# Patient Record
Sex: Male | Born: 1995 | Race: White | Hispanic: No | Marital: Married | Smoking: Never smoker
Health system: Southern US, Community
[De-identification: ages and names within clinical notes are randomized; demographics above are authoritative.]

## PROBLEM LIST (undated history)

## (undated) DIAGNOSIS — J45909 Unspecified asthma, uncomplicated: Secondary | ICD-10-CM

## (undated) DIAGNOSIS — K3580 Unspecified acute appendicitis: Secondary | ICD-10-CM

## (undated) DIAGNOSIS — F909 Attention-deficit hyperactivity disorder, unspecified type: Secondary | ICD-10-CM

## (undated) HISTORY — PX: CIRCUMCISION: SUR203

---
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
## 2015-01-07 ENCOUNTER — Ambulatory Visit (INDEPENDENT_AMBULATORY_CARE_PROVIDER_SITE_OTHER): Payer: 59 | Admitting: Pediatrics

## 2015-01-07 ENCOUNTER — Encounter: Payer: Self-pay | Admitting: Pediatrics

## 2015-01-07 VITALS — BP 112/58 | HR 64 | Ht 73.0 in | Wt 146.2 lb

## 2015-01-07 DIAGNOSIS — G2569 Other tics of organic origin: Secondary | ICD-10-CM

## 2015-01-07 DIAGNOSIS — F988 Other specified behavioral and emotional disorders with onset usually occurring in childhood and adolescence: Secondary | ICD-10-CM

## 2015-01-07 DIAGNOSIS — F909 Attention-deficit hyperactivity disorder, unspecified type: Secondary | ICD-10-CM

## 2015-01-07 NOTE — Progress Notes (Signed)
Patient: Brandon Juarez MRN: [id number] Sex: male DOB: Jan 26, 1996  Provider: Deetta Perla, MD Location of Care: [facility]  Note type: Routine return visit  History of Present Illness: Referral Source: Dr. Nila Nephew History from: mother and patient Chief Complaint: ADD  Brandon Juarez is a 19 y.o. male referred for evaluation of ADD and motor tics who is being seen here for follow-up. Brandon Juarez says he is doing well. Since decreasing to the  of vyvanse, he has been having less side effects. His appetite is good and his sleep is so good, that he has stopped taking the clonidine at night for sleep.   He says he is able to focus through 6th period in school and has an IEP where he only has a new class for 1st-5th period and sits though the calculus lecture a second time for 6th period. He says homework is a struggle, but he is taking rigorous classes, including 2 AP classes. His grades are 2 As, 2 Bs and 1 C.   He continues to have motor tics, but they have improved since lowering the vyvanse dose. These tics do not interfere with his daily activities. He and his mom are both satisfied with where he in on his current medicine regimen.   Review of Systems: 12 system review was remarkable for attention span/ADD  Past Medical History Diagnosis Date  . ADHD (attention deficit hyperactivity disorder) 11/05/2014  . Acute appendicitis 11/05/2014  . 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  , Head Injury: No., Nervous System Infections: No., Immunizations up to date: Yes.    See surgical Hx for hospitalization.  Birth History 7 lbs. 10 oz. Infant born at [redacted] weeks gestational age  Gestation was complicated by excessive nausea and vomiting, severe headaches, and possible dental x-rays.  Mother received Pitocin and Epidural anesthesia primary cesarean section after 96 hours of labor  Nursery Course was complicated by  jaundice.  Growth and Development was recalled and recorded as delay largely in the areas of expressive language and self-help skills.  Behavior History None  Surgical History Procedure Laterality Date  . Finger surgery Right 2002    Right hand center finger nailbed repair.  . Circumcision  1997  . Laparoscopic appendectomy N/A 11/05/2014    Procedure: APPENDECTOMY LAPAROSCOPIC;  Surgeon: Avel Peace, MD;  Location: [facility];  Service: General;  Laterality: N/A;  . Wisdom tooth extraction  10/2014    4 Wisdom teeth removed    Family History family history includes Congestive Heart Failure in his maternal grandfather. Family history is negative for migraines, seizures, intellectual disabilities, blindness, deafness, birth defects, chromosomal disorder, or autism.  Social History . Marital Status: Married    Spouse Name: N/A  . Number of Children: N/A  . Years of Education: N/A   Social History Main Topics  . Smoking status: Never Smoker   . Smokeless tobacco: Never Used  . Alcohol Use: No  . Drug Use: No  . Sexual Activity: No   Social History Narrative  Educational level 12th grade School Attending: [employer]. Occupation: Consulting civil engineer  Living with parents and brother  Hobbies/Interest: Enjoys playing basketball for his church and [employer].  School comments Brandon Juarez is doing well in school.   Allergies Allergen Reactions  . Other     Seasonal Allergies   Physical Exam BP 112/58 mmHg  Pulse 64  Ht  (1.854 m)  Wt 146  lb 3.2 oz (66.316 kg)  BMI 19.29 kg/m2  General: alert, well developed, well nourished, in no acute distress, brown hair, brown eyes, right handed Head: normocephalic, no dysmorphic features Ears, Nose and Throat: Otoscopic: tympanic membranes normal; pharynx: oropharynx is pink without exudates or tonsillar hypertrophy Neck: supple, full range of motion, no cranial or cervical bruits Respiratory: auscultation  clear Cardiovascular: no murmurs, pulses are normal Musculoskeletal: no skeletal deformities or apparent scoliosis Skin: no neurocutaneous lesions; mild facial acne  Neurologic Exam  Mental Status: alert; oriented to person, place and year; knowledge is normal for age; language is normal Cranial Nerves: visual fields are full to double simultaneous stimuli; extraocular movements are full and conjugate; pupils are round reactive to light; funduscopic examination shows sharp disc margins with normal vessels; symmetric facial strength; midline tongue and uvula; air conduction is greater than bone conduction bilaterally Motor: Normal strength, tone and mass; good fine motor movements; no pronator drift Sensory: intact responses to cold, vibration, proprioception and stereognosis Coordination: good finger-to-nose, rapid repetitive alternating movements and finger apposition Gait and Station: normal gait and station: patient is able to walk on heels, toes and tandem without difficulty; balance is adequate; Romberg exam is negative; Gower response is negative Reflexes: symmetric and diminished bilaterally; no clonus; bilateral flexor plantar responses  Assessment   ICD-9-CM ICD-10-CM   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 Attention deficit disorder 314.00 F90.9    Discussion Brandon Juarez is a 19 year old with ADD and motor tics who is here today for follow-up. Mom and Brandon Juarez are very pleased with how Brandon Juarez is doing at this time on 20 mg Vyvanse and not taking the clonidine for sleep. They report the Dr. Chilton SiGreen is comfortable taking care of prescribing the medicine at this time, and if we are on board, they will only see us on a prn basis. We agree with this.   Plan 1. Continue Vyvanse 20mg  daily in the morning. 2. Discontinue Clonidine. 3. If Brandon Juarez needs a full evaluation for documentation at college, we will refer to Dr. Melvyn NethLewis, a neuropsychologist. If he just needs a physician's verification  and past documentation, we are happy to do that for him.    Medication List   This list is accurate as of: 01/07/15  3:02 PM.       albuterol 108 (90 BASE) MCG/ACT inhaler  Commonly known as:  PROVENTIL HFA;VENTOLIN HFA  Inhale 2 puffs into the lungs every 6 (six) hours as needed for wheezing.     beclomethasone 40 MCG/ACT inhaler  Commonly known as:  QVAR  Inhale 2 puffs into the lungs 2 (two) times daily.     ibuprofen 200 MG tablet  Commonly known as:  MOTRIN IB  You can take 2-3 every 6 hours as needed for pain.     VYVANSE 20 MG capsule  Generic drug:  lisdexamfetamine  Take 1 capsule daily      The medication list was reviewed and reconciled. All changes or newly prescribed medications were explained.  A complete medication list was provided to the patient/caregiver.  Patient was seen with E. Judson RochPaige Darnell, MD, PGY-1.  30 minutes of face-to-face time was spent with Brandon Juarez and his mother, more than half of it in consultation.  I performed physical examination, participated in history taking, and guided decision making.  Deetta PerlaWilliam H Hickling MD

## 2015-01-07 NOTE — Patient Instructions (Signed)
We can transfer your care back to Dr. Chilton SiGreen.  I will be happy to see you if you need help.  In partiicular this might be documentation about your attention deficit disorder unless they need a current evaluation.  You may need to turn to Dr. Melvyn NethLewis for that.

## 2015-05-24 IMAGING — CT CT ABD-PELV W/ CM
2 of 4 series · 17 of 46 positions shown, 19 images · IV contrast (OMNIPAQUE)
Comparison: None.

CLINICAL DATA: Right lower quadrant pain

EXAM:
CT ABDOMEN AND PELVIS WITH CONTRAST
TECHNIQUE: Multidetector CT imaging of the abdomen and pelvis was performed
using the standard protocol following bolus administration of
intravenous contrast.
CONTRAST:  50mL OMNIPAQUE IOHEXOL 300 MG/ML SOLN, 100mL OMNIPAQUE
IOHEXOL 300 MG/ML SOLN

[Series 2: rtn a/p with · axial · 0.71mm/px · z∈[-476,-70]mm · 14 of 89 slices shown, 16 images]
[im 4/89  soft-tissue]
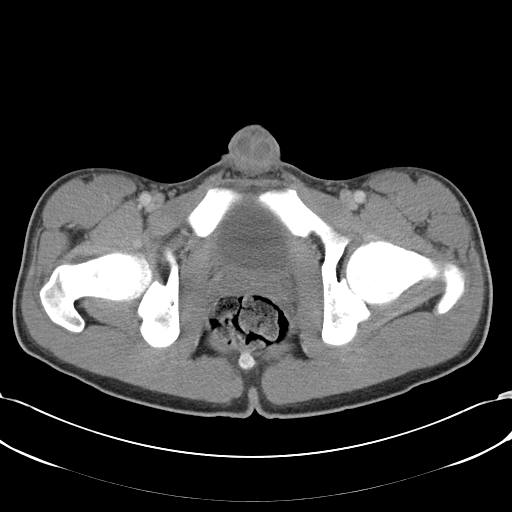
[im 4/89  bone]
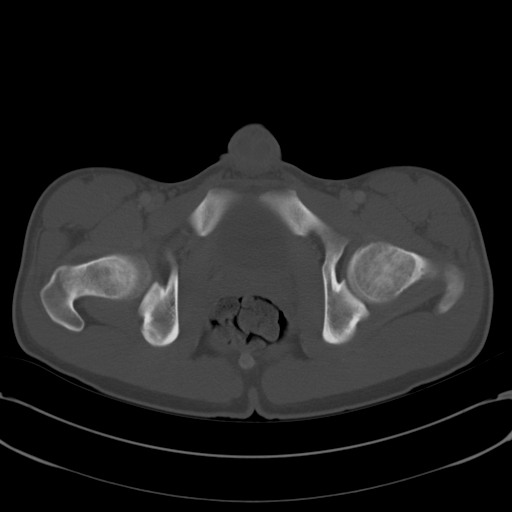
[im 11/89  soft-tissue]
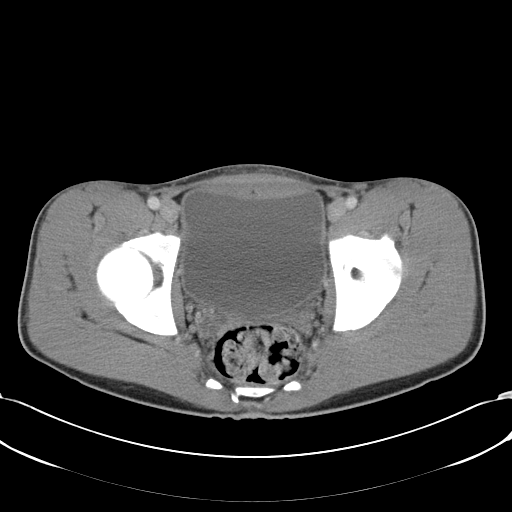
[im 18/89  soft-tissue]
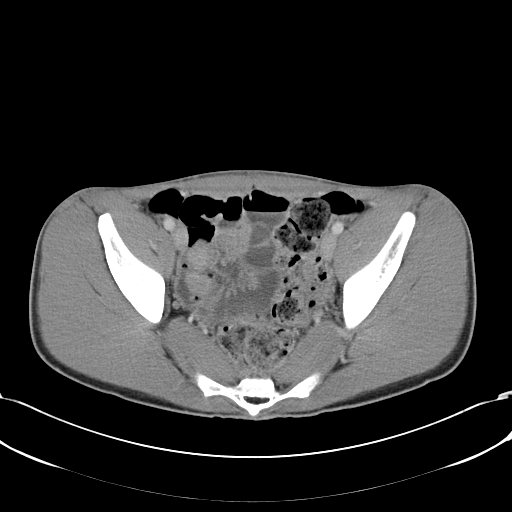
[im 25/89  soft-tissue]
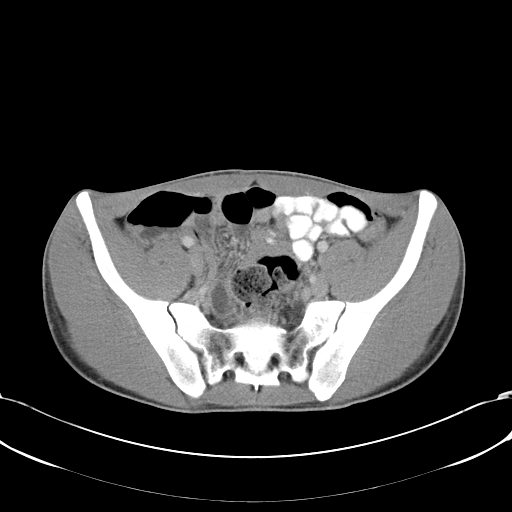
[im 29/89  soft-tissue]
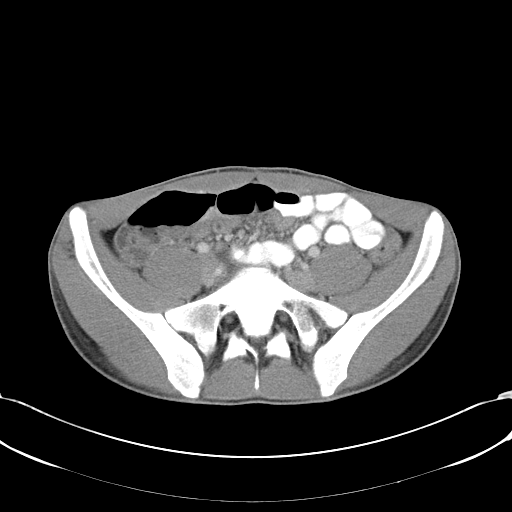
[im 36/89  soft-tissue]
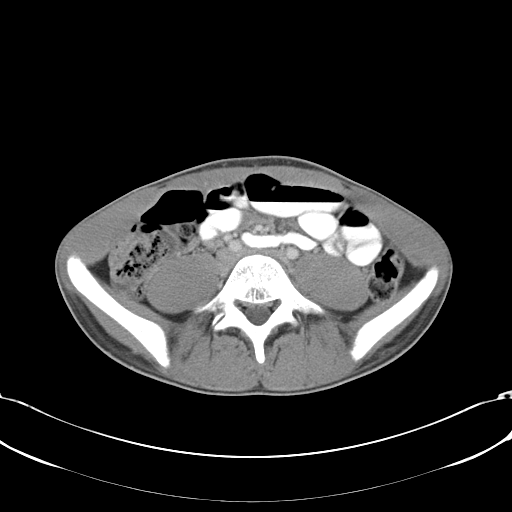
[im 43/89  soft-tissue]
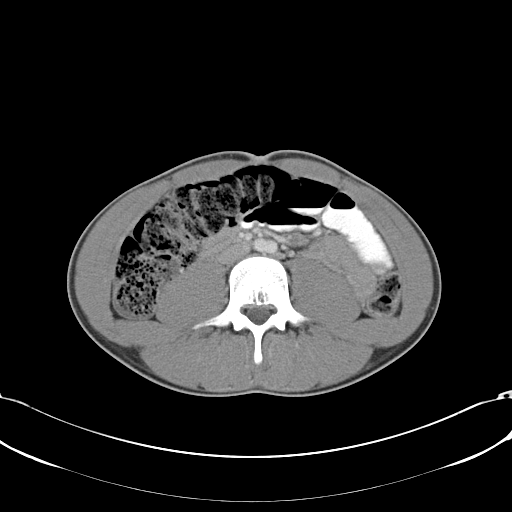
[im 46/89  soft-tissue]
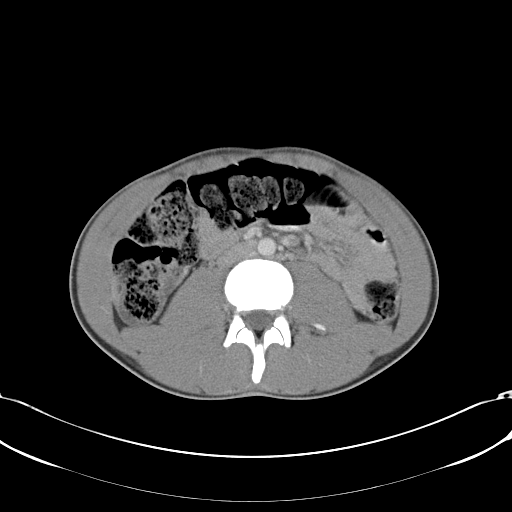
[im 53/89  soft-tissue]
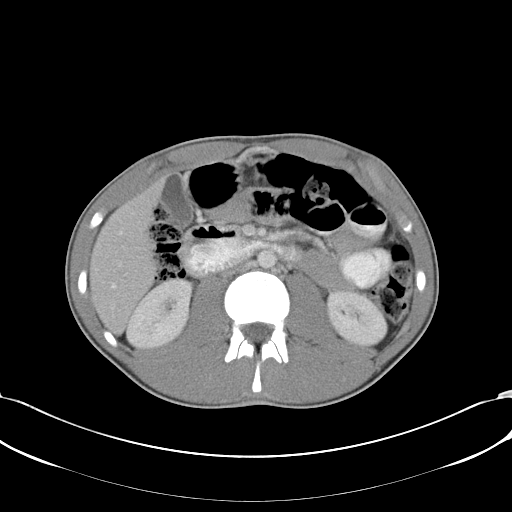
[im 53/89  bone]
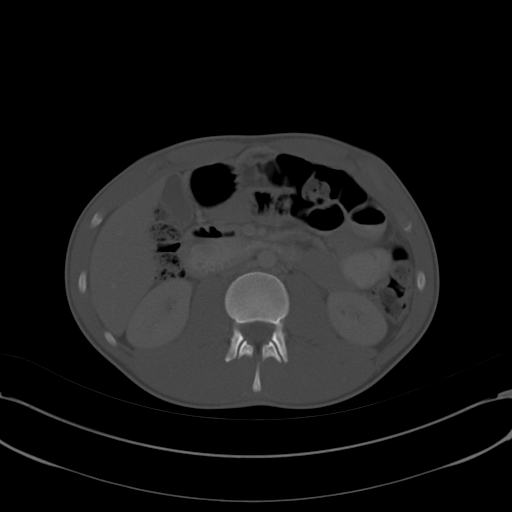
[im 60/89  soft-tissue]
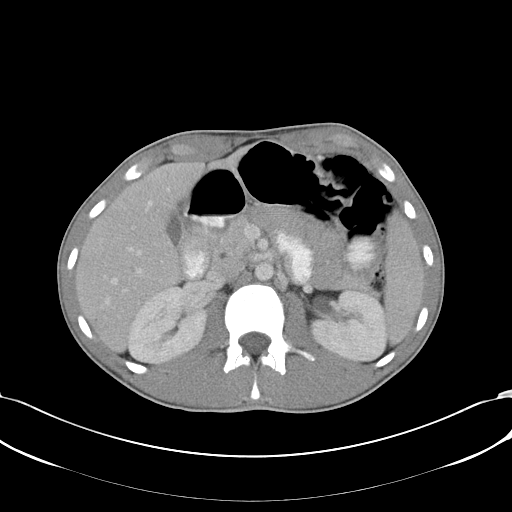
[im 67/89  soft-tissue]
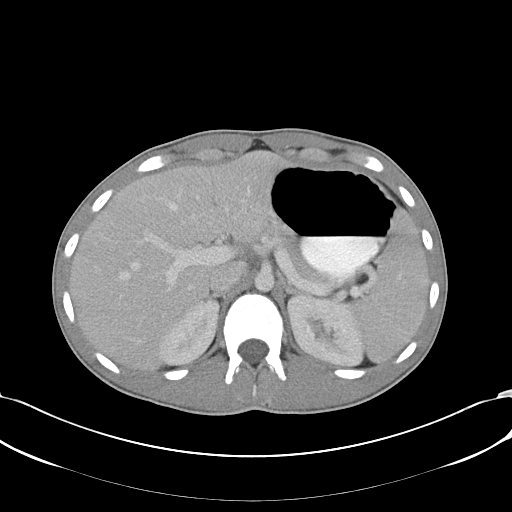
[im 71/89  soft-tissue]
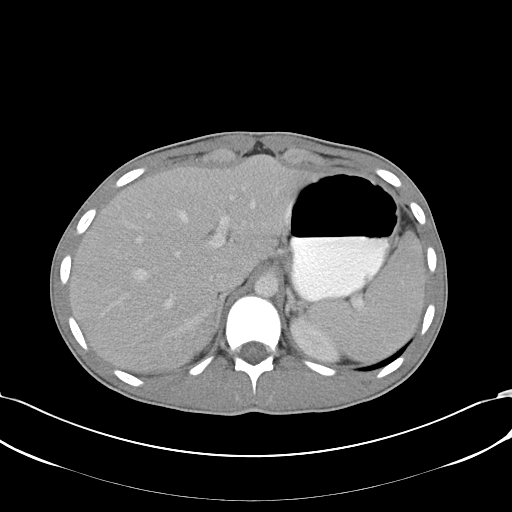
[im 78/89  soft-tissue]
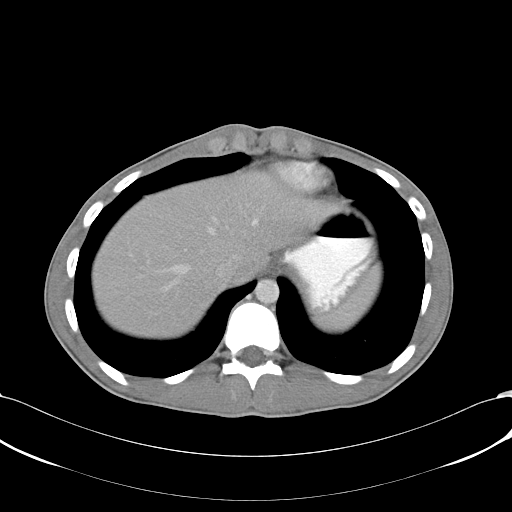
[im 85/89  soft-tissue]
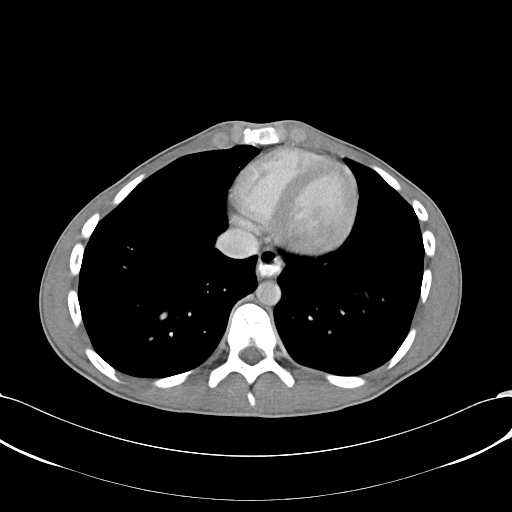

[Series 602: <mpr thick range> · coronal · 0.87mm/px · 3 of 67 slices shown]
[im 23/67  soft-tissue]
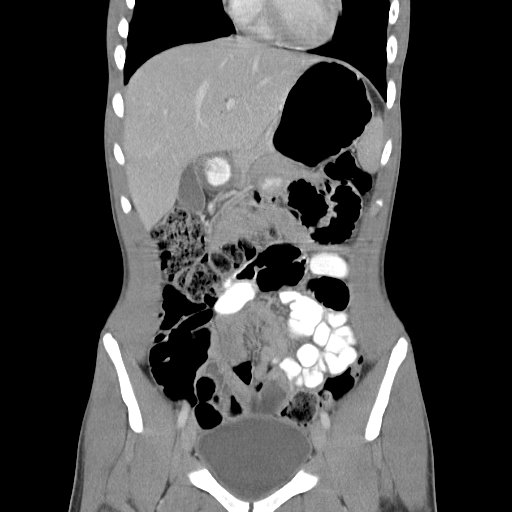
[im 30/67  soft-tissue]
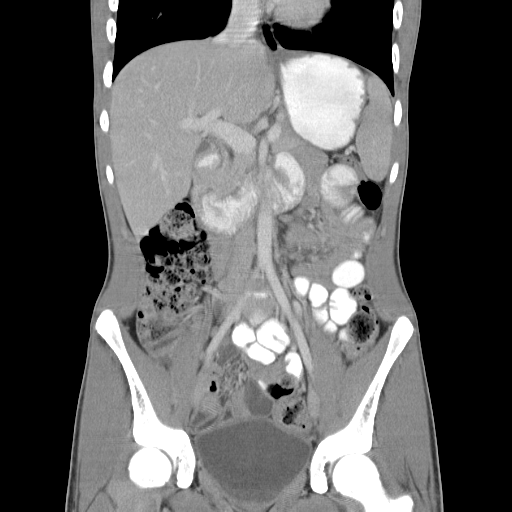
[im 37/67  soft-tissue]
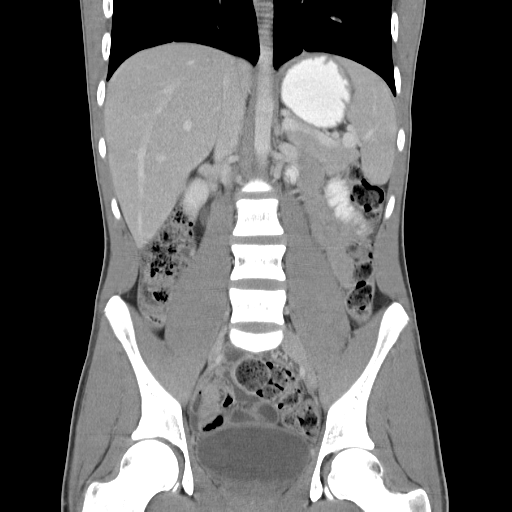

[17 of 46 positions shown; findings below may reference images not displayed]

FINDINGS: Lung bases are free of acute infiltrate or sizable effusion.

The liver, gallbladder, spleen, adrenal glands and pancreas are all
normal in their CT appearance. The kidneys are well visualized
bilaterally without renal calculi or obstructive changes. No
significant inflammatory changes are noted in the right lower
quadrant although an appendicolith is seen within the appendix which
appears somewhat distended peripherally. Correlation with patient's
white blood cell count is recommended.

The bladder is well distended. No pelvic mass lesion is seen. No
bony abnormality is noted.
IMPRESSION: Appendicolith with peripheral dilatation of the appendix. No
significant inflammatory changes are noted. This may represent some
very early appendicitis. Correlation with the white blood cell count
is recommended.

## 2015-07-07 ENCOUNTER — Ambulatory Visit
Admission: RE | Admit: 2015-07-07 | Discharge: 2015-07-07 | Disposition: A | Discharge status: Home / Self Care | Payer: 59 | Source: Ambulatory Visit | Attending: Internal Medicine | Admitting: Internal Medicine

## 2015-07-07 ENCOUNTER — Other Ambulatory Visit: Payer: Self-pay | Admitting: Internal Medicine

## 2015-07-07 DIAGNOSIS — R599 Enlarged lymph nodes, unspecified: Secondary | ICD-10-CM

## 2016-01-23 IMAGING — CR DG CHEST 2V
2 series · 2 of 2 positions shown · non-contrast
Comparison: PA and lateral chest x-ray December 21, 2009

CLINICAL DATA: Palpable nodule over the left supraclavicular
region, no chest complaints, suspect enlarged lymph node, history of
asthma.

EXAM:
CHEST  2 VIEW

[w chest pa]
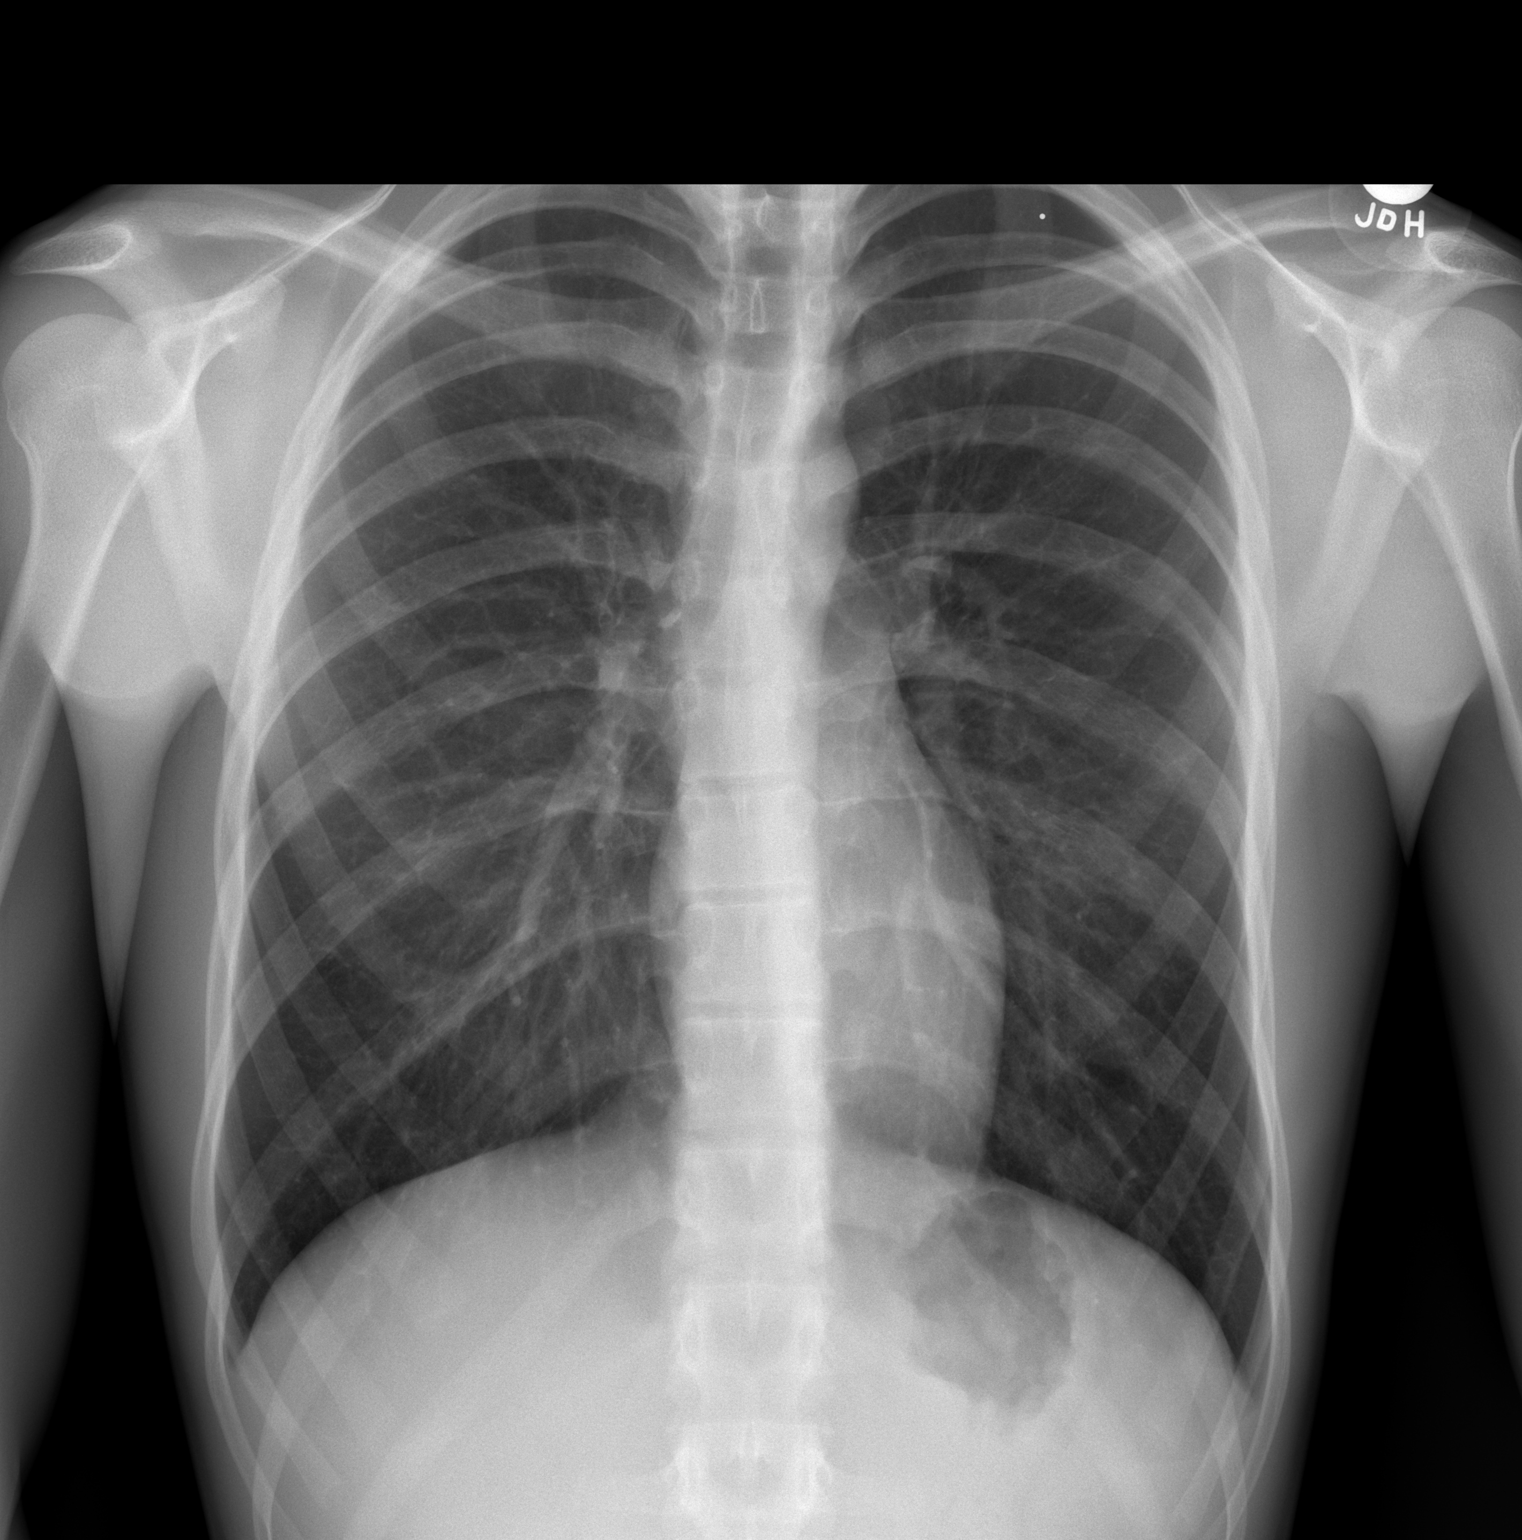

[w chest lat]
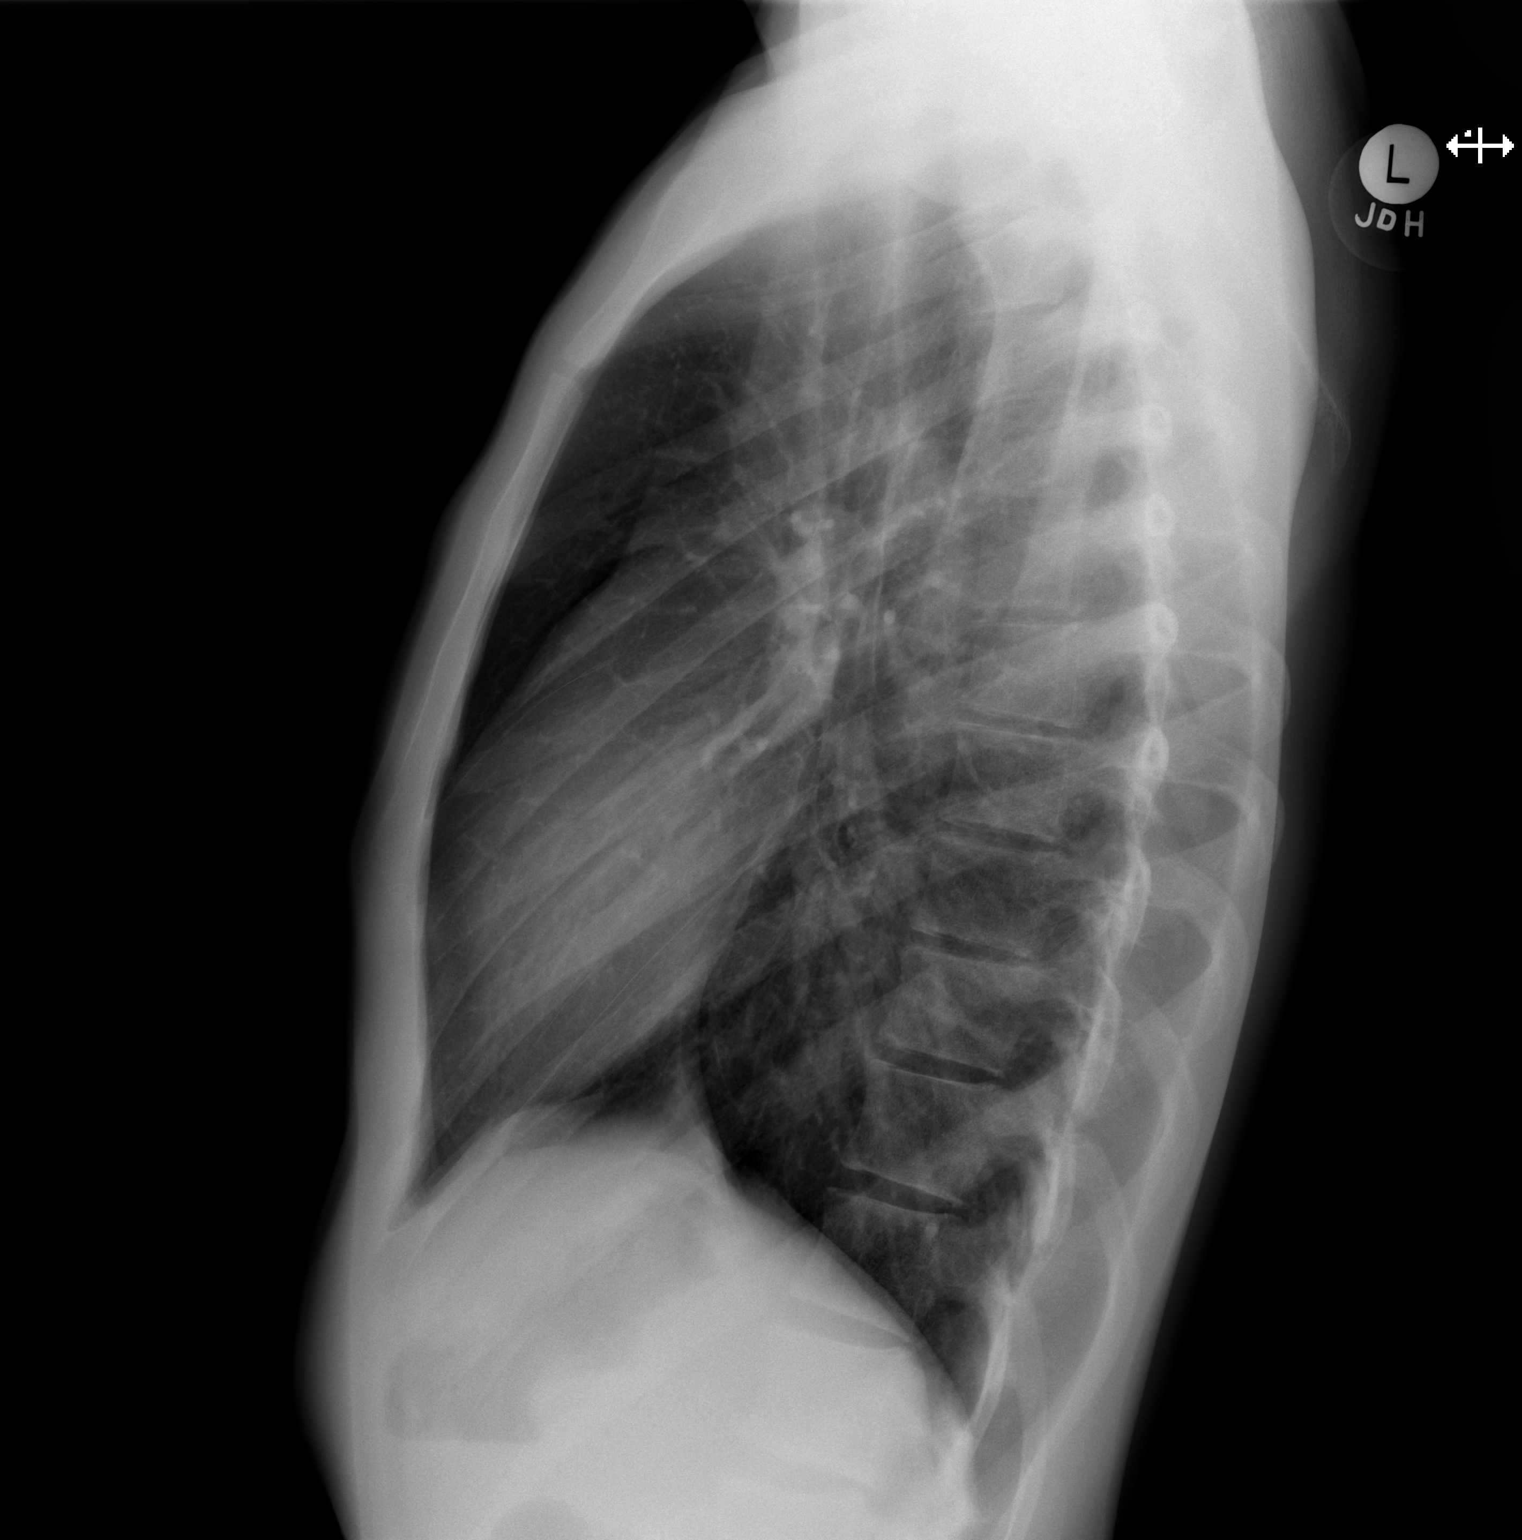

[2 of 2 positions shown; findings below may reference images not displayed]

FINDINGS: The lungs are well-expanded. There is no focal infiltrate. There is
no pleural effusion. The heart and pulmonary vascularity is normal.
There is no mediastinal nor hilar lymphadenopathy. No abnormal soft
tissue density is demonstrated in the supraclavicular regions. The
bony thorax is unremarkable.
IMPRESSION: There is no acute cardiopulmonary abnormality. Given the palpable
supraclavicular finding, chest, abdomen, and pelvis CT scanning is
recommended to evaluate the patient for lymphadenopathy elsewhere.

## 2020-06-20 DIAGNOSIS — Z20822 Contact with and (suspected) exposure to covid-19: Secondary | ICD-10-CM | POA: Diagnosis not present

## 2020-09-25 ENCOUNTER — Other Ambulatory Visit: Payer: 59

## 2020-09-25 DIAGNOSIS — Z20822 Contact with and (suspected) exposure to covid-19: Secondary | ICD-10-CM

## 2020-09-26 LAB — NOVEL CORONAVIRUS, NAA: SARS-CoV-2, NAA: NOT DETECTED

## 2020-09-26 LAB — SARS-COV-2, NAA 2 DAY TAT

## 2020-10-21 DIAGNOSIS — Z23 Encounter for immunization: Secondary | ICD-10-CM | POA: Diagnosis not present

## 2021-07-16 DIAGNOSIS — F909 Attention-deficit hyperactivity disorder, unspecified type: Secondary | ICD-10-CM | POA: Diagnosis not present

## 2021-09-08 DIAGNOSIS — H10023 Other mucopurulent conjunctivitis, bilateral: Secondary | ICD-10-CM | POA: Diagnosis not present

## 2021-10-21 DIAGNOSIS — Z23 Encounter for immunization: Secondary | ICD-10-CM | POA: Diagnosis not present

## 2021-10-21 DIAGNOSIS — B070 Plantar wart: Secondary | ICD-10-CM | POA: Diagnosis not present

## 2021-10-21 DIAGNOSIS — F909 Attention-deficit hyperactivity disorder, unspecified type: Secondary | ICD-10-CM | POA: Diagnosis not present

## 2021-12-01 DIAGNOSIS — Z789 Other specified health status: Secondary | ICD-10-CM | POA: Diagnosis not present

## 2021-12-01 DIAGNOSIS — R208 Other disturbances of skin sensation: Secondary | ICD-10-CM | POA: Diagnosis not present

## 2021-12-01 DIAGNOSIS — L0889 Other specified local infections of the skin and subcutaneous tissue: Secondary | ICD-10-CM | POA: Diagnosis not present

## 2021-12-01 DIAGNOSIS — B078 Other viral warts: Secondary | ICD-10-CM | POA: Diagnosis not present

## 2022-01-04 DIAGNOSIS — B078 Other viral warts: Secondary | ICD-10-CM | POA: Diagnosis not present

## 2022-01-04 DIAGNOSIS — L538 Other specified erythematous conditions: Secondary | ICD-10-CM | POA: Diagnosis not present

## 2022-01-04 DIAGNOSIS — Z789 Other specified health status: Secondary | ICD-10-CM | POA: Diagnosis not present

## 2022-02-02 DIAGNOSIS — R208 Other disturbances of skin sensation: Secondary | ICD-10-CM | POA: Diagnosis not present

## 2022-02-02 DIAGNOSIS — L298 Other pruritus: Secondary | ICD-10-CM | POA: Diagnosis not present

## 2022-02-02 DIAGNOSIS — R238 Other skin changes: Secondary | ICD-10-CM | POA: Diagnosis not present

## 2022-02-02 DIAGNOSIS — B078 Other viral warts: Secondary | ICD-10-CM | POA: Diagnosis not present

## 2022-03-11 DIAGNOSIS — L538 Other specified erythematous conditions: Secondary | ICD-10-CM | POA: Diagnosis not present

## 2022-03-11 DIAGNOSIS — R208 Other disturbances of skin sensation: Secondary | ICD-10-CM | POA: Diagnosis not present

## 2022-03-11 DIAGNOSIS — B078 Other viral warts: Secondary | ICD-10-CM | POA: Diagnosis not present

## 2022-03-11 DIAGNOSIS — L298 Other pruritus: Secondary | ICD-10-CM | POA: Diagnosis not present

## 2022-04-15 DIAGNOSIS — R238 Other skin changes: Secondary | ICD-10-CM | POA: Diagnosis not present

## 2022-04-15 DIAGNOSIS — B078 Other viral warts: Secondary | ICD-10-CM | POA: Diagnosis not present

## 2022-04-15 DIAGNOSIS — L298 Other pruritus: Secondary | ICD-10-CM | POA: Diagnosis not present

## 2022-04-15 DIAGNOSIS — R208 Other disturbances of skin sensation: Secondary | ICD-10-CM | POA: Diagnosis not present

## 2022-05-17 DIAGNOSIS — M25562 Pain in left knee: Secondary | ICD-10-CM | POA: Diagnosis not present

## 2022-10-20 DIAGNOSIS — H6121 Impacted cerumen, right ear: Secondary | ICD-10-CM | POA: Diagnosis not present

## 2022-10-20 DIAGNOSIS — F909 Attention-deficit hyperactivity disorder, unspecified type: Secondary | ICD-10-CM | POA: Diagnosis not present

## 2024-07-03 DIAGNOSIS — Z1330 Encounter for screening examination for mental health and behavioral disorders, unspecified: Secondary | ICD-10-CM | POA: Diagnosis not present

## 2024-07-03 DIAGNOSIS — K08403 Partial loss of teeth, unspecified cause, class III: Secondary | ICD-10-CM | POA: Diagnosis not present

## 2024-07-03 DIAGNOSIS — E785 Hyperlipidemia, unspecified: Secondary | ICD-10-CM | POA: Diagnosis not present

## 2024-07-03 DIAGNOSIS — R5383 Other fatigue: Secondary | ICD-10-CM | POA: Diagnosis not present

## 2024-07-03 DIAGNOSIS — Z1281 Encounter for screening for malignant neoplasm of oral cavity: Secondary | ICD-10-CM | POA: Diagnosis not present

## 2024-07-03 DIAGNOSIS — Z1329 Encounter for screening for other suspected endocrine disorder: Secondary | ICD-10-CM | POA: Diagnosis not present

## 2024-07-03 DIAGNOSIS — Z13220 Encounter for screening for lipoid disorders: Secondary | ICD-10-CM | POA: Diagnosis not present

## 2024-07-03 DIAGNOSIS — Z0000 Encounter for general adult medical examination without abnormal findings: Secondary | ICD-10-CM | POA: Diagnosis not present

## 2024-07-03 DIAGNOSIS — Z1384 Encounter for screening for dental disorders: Secondary | ICD-10-CM | POA: Diagnosis not present
